# Patient Record
Sex: Female | Born: 1990 | Race: White | Hispanic: No | Marital: Single | Smoking: Former smoker
Health system: Southern US, Community
[De-identification: ages and names within clinical notes are randomized; demographics above are authoritative.]

## PROBLEM LIST (undated history)

## (undated) DIAGNOSIS — Z89512 Acquired absence of left leg below knee: Secondary | ICD-10-CM

## (undated) DIAGNOSIS — Z789 Other specified health status: Secondary | ICD-10-CM

## (undated) HISTORY — PX: KNEE SURGERY: SHX244

## (undated) HISTORY — PX: OTHER SURGICAL HISTORY: SHX169

## (undated) HISTORY — PX: LEG AMPUTATION: SHX1105

---
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
## 2011-01-14 NOTE — Discharge Summary (Signed)
NAMEBRANDELYN, Brandi Casey NO.:  [id number]   MEDICAL RECORD NO.:  [id number]          PATIENT TYPE:  INP   LOCATION:  6125                         FACILITY:  [facility]   PHYSICIAN:  Burnard Bunting, M.D.    DATE OF BIRTH:  June 17, 1991   DATE OF ADMISSION:  12/22/2006  DATE OF DISCHARGE:  12/28/2006                               DISCHARGE SUMMARY   DISCHARGE DIAGNOSIS:  Right knee multi-ligament injury.   SECONDARY DIAGNOSES:  1. History of left above-the-knee amputation.  2. Increased body mass index.   OPERATION/PROCEDURE:  Right knee push off corner reconstruction with  Achilles allograft with diagnostic arthroscopy performed December 22, 2006.   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  She presents after failure of  conservative management.  She and her grandmother, who is her caretaker,  understand the risks and benefits.   HOSPITAL COURSE:  The patient was admitted to orthopedic surgery December 22, 2006.  She underwent push off corner reconstruction December 22, 2006.  She tolerated the procedure well without any immediate complications.  Postop day #1, her dorsiflexion and inversion were weak, but her plantar  flexion was intact.  She has mild dorsal paresthesias.  The weakness and  paresthesias resolved by postop day 2 to 3.  She was okay to weight bear  with knee immobilizer.  She was slow to mobilize because of her  contralateral above-the-knee amputation.  Compartments were soft on  postop day #1.  Foot remained perfused.  She was started on CPN machine  for range of motion.  She otherwise had an unremarkable recovery.  Incision was intact on postop day #3.  By postop day #6, the patient has  perfused foot which was mobile with dorsiflexion, plantar flexion  insensate.  She was discharged home in good condition with partial  weight bearing on the right lower extremity with a knee immobilizer.   DISCHARGE MEDICATIONS:  1. Percocet 1 or 2 p.o. every three to four hours p.r.n. pain.  2. Robaxin 500 mg p.o. every eight hours for muscle relaxer.   She will follow up with me in 7 days for suture removal.      Burnard Bunting, M.D.  Electronically Signed     GSD/MEDQ  D:  02/10/2007  T:  02/11/2007  Job:  [id number]

## 2011-01-17 NOTE — Op Note (Signed)
NAME:  Brandi Casey, Brandi Casey                        ACCOUNT NO.:  [id number]   MEDICAL RECORD NO.:  [id number]                   PATIENT TYPE:  AMB   LOCATION:  DSC                                  FACILITY:   PHYSICIAN:  Lucky Cowboy, M.D.                    DATE OF BIRTH:  28-Sep-1990   DATE OF PROCEDURE:  12/16/2002  DATE OF DISCHARGE:                                 OPERATIVE REPORT   PREOPERATIVE DIAGNOSIS:  Chronic eustachian tube dysfunction with conductive  hearing losses and serous otitis media.   POSTOPERATIVE DIAGNOSIS:  Chronic eustachian tube dysfunction with  conductive hearing losses and serous otitis media.   PROCEDURE:  Bilateral tympanotomy with tube placement.   SURGEON:  Lucky Cowboy, M.D.   ANESTHESIA:  General.   ESTIMATED BLOOD LOSS:  None.   COMPLICATIONS:  None.   INDICATIONS:  This patient is a 20 year old female with intermittent ear  pain.  There has been some concern for hearing in which a conductive hearing  loss was confirmed by audiometry in late March.  She was found to have  severely retracted tympanic membranes with scant, serous, middle ear fluid.  For these reasons, tympanotomy tubes are placed.   FINDINGS:  The patient was noted to have severely retracted tympanic  membranes;  however, there was only scant, middle ear serous fluid and no  significant edema.   PROCEDURE:  The patient was taken to the operating room and placed on the  table in the supine position.  She was then placed under general mask  anesthesia and a #4 ear speculum placed into the right external auditory  canal.  With the aid of the operating microscope, cerumen was removed with a  curet and suctioned.  A myringotomy knife was used to make an incision in  the anterior-inferior quadrant.  Middle ear fluid was evacuated and an  Activent tube placed through the tympanic membrane and secured in place with  a pick.  Ciprodex Otic was instilled.  Attention was turned to the left  ear.  In a similar fashion, cerumen was removed.  A myringotomy knife was used to  make an incision in the anterior-inferior quadrant. Middle ear fluid was  evacuated and an Activent tube placed through the  tympanic membrane and secured in place with a pick.  Ciprodex Otic was  instilled.  The patient was awakened from anesthesia and taken to the post-  anesthesia care unit in stable condition.  There were no complications.  Return appointment with Dr. Gerilyn Pilgrim is on 01/06/2003.                                               Lucky Cowboy, M.D.    SJ/MEDQ  D:  12/16/2002  T:  12/16/2002  Job:  [id number]   cc:   Fanny Dance. Rankins, M.D.  [address]  [place]  [place] [address]  Fax: [phone]

## 2011-01-17 NOTE — Op Note (Signed)
NAME:  Brandi Casey, Brandi Casey NO.:  [id number]   MEDICAL RECORD NO.:  [id number]          PATIENT TYPE:  INP   LOCATION:  2550                         FACILITY:  [facility]   PHYSICIAN:  Burnard Bunting, M.D.    DATE OF BIRTH:  March 26, 1991   DATE OF PROCEDURE:  12/22/2006  DATE OF DISCHARGE:                               OPERATIVE REPORT   PREOPERATIVE DIAGNOSIS:  Right knee posterolateral corner injury.   POSTOPERATIVE DIAGNOSIS:  Right knee posterolateral corner injury.   PROCEDURE:  Right knee diagnostic arthroscopy with posterolateral corner  reconstruction with Achilles tendon allograft.   SURGEON:  Burnard Bunting, M.D.   ASSISTANT:  Jerolyn Shin. Tresa Res, M.D.   ANESTHESIA:  General endotracheal.   ESTIMATED BLOOD LOSS:  25 mL.   DRAINS:  None.  Hemovac utilized x1.   TOURNIQUET TIME:  Two hours at 300 mmHg.   INDICATIONS:  Brandi Casey is a morbidly obese 20 year old female who  injured her right knee.  Examination in the office demonstrated lateral  LCL injury and posterolateral corner injury.  She was treated in a brace  and the lateral collateral ligament injury has improved.  However, she  continues to report pain and symptomatic posterolateral corner  instability.  She presents now for operative management after  explanation of risks and benefits.   OPERATIVE FINDINGS:  Examination under anesthesia:  Range of motion at 7  degrees of hyperextension to full flexion with stability to valgus  stress at 0 and 30.  In hyperextension she had no openings to varus  stress.  In full extension the patient had about 1-mm opening to varus  stress, and at 30 degrees the patient had about 3-mm opening to varus  stress.  The left leg is not available for comparison because of  amputation.  The patient had an intact ACL/PCL.  The patient did have  increased external rotation at 30 degrees, but not at 90 degrees,  consistent with posterolateral corner injury.   Diagnostic  arthroscopy:  1. Intact patellofemoral compartment.  2. Intact lateral compartment.  3. Intact medial compartment.  4. Intact ACL/PCL.   PROCEDURE IN DETAIL:  The patient was brought to the operating room  where general endotracheal anesthesia was induced.  Preoperative  antibiotics were administered.  The right leg was prepped with DuraPrep  solution and draped in a sterile manner.  Collier Flowers was used to cover the  operative field.  __________ anatomy was identified, including the  medial and lateral aspect of the patella tendon, the medial and lateral  joint line.  An anterior inferior lateral portal was then established in  order for good visualization.  Diagnostic arthroscopy was performed.  The patellofemoral compartment was intact.  The lateral compartment was  intact.  The medial compartment was intact.  The ACL and PCL were  intact.  There were no intraarticular findings.  At this time, the  instruments were removed, portals were closed using 3-0 nylon suture.  The incision was then made on the lateral aspect of the patient's leg,  centered down to Gerdy tubercle, extending between the fibular  head and  the lateral condyle.  Skin and subcutaneous were sharply divided.  Full  thickness skin flaps were elevated off the fascia lata and iliotibial  band.  Three fascial windows of [place] were then created.  The first  fascial window inferiorly behind the biceps tendon was used to isolate  and protect the peroneal nerve.  This was dissected proximally through  the bifurcation and distally to its penetration to the anterior  compartment fascia.  Once the peroneal nerve was isolated, the inferior  aspect of the iliotibial band was entered and dissection was performed  anterior to the lateral gastroc.  A portion of the iliotibial band was  then incised which allowed access to the location of the popliteus.  At  this time, dissection was performed posteriorly to the sulcus of the   popliteus.  The anterior aspect of the popliteal hiatus was identified  and a 20 x 10-mm drill hole was made over the anterior aspect of the  popliteus attachment to the femur.  A guide pin was then placed under  direct visualization and with fluoroscopic assistance between the Gerdy  tubercle and the tibial tubercle.  A 10-mm tunnel was then drilled over  the guide pin with a curet used to protect the posterior neurovascular  structures.  This was also done under fluoroscopic guidance after  correct confirmation of the guide pin.  After creation of a tunnel, the  Achilles tendon allograft, which was being prepared on the back table  concurrently was then fastened into the femur using an 8 x 22-mm  interference screw with good fit obtained.  The graft was then passed  medial to the lateral collateral ligament and down through the tunnel  created in the tibia from posterior to anterior.  With the knee at 30  degrees of flexion and maximum internal rotation, the graft was stapled  down to the tibia.  Improvement in external rotation and stability was  noted.  The patient's knee was taken through a range of motion and found  to be stable.  At this time, the tourniquet was released.  Bleeding  points were counter controlled using bipolar and normal electrocautery.  Fascial wound edges were closed using 0 Vicryl suture.  Skin was closed  using interrupted inverted 0 Vicryl suture, 2-0 Vicryl suture, and a  running 3-0 Prolene.  Hemovac drain was placed.  The patient's foot was  perfused.  A bulky dressing and knee immobilizer was placed.  It should  be noted that the patient was morbidly obese for her age.  Body mass  index exceeded 35.  The assistance of Dr. Tresa Res was required for both  graft preparation and retraction of important neurovascular structures.  His assistance was of maximum necessity.      Burnard Bunting, M.D.  Electronically Signed    GSD/MEDQ  D:  12/22/2006  T:   12/22/2006  Job:  [id number]

## 2011-11-29 ENCOUNTER — Other Ambulatory Visit: Payer: Self-pay

## 2011-11-29 ENCOUNTER — Encounter (HOSPITAL_COMMUNITY): Payer: Self-pay

## 2011-11-29 ENCOUNTER — Emergency Department (HOSPITAL_COMMUNITY): Payer: Self-pay

## 2011-11-29 ENCOUNTER — Emergency Department (HOSPITAL_COMMUNITY)
Admission: EM | Admit: 2011-11-29 | Discharge: 2011-11-29 | Disposition: A | Discharge status: Home / Self Care | Payer: Self-pay | Attending: Emergency Medicine | Admitting: Emergency Medicine

## 2011-11-29 DIAGNOSIS — M546 Pain in thoracic spine: Secondary | ICD-10-CM | POA: Insufficient documentation

## 2011-11-29 DIAGNOSIS — M549 Dorsalgia, unspecified: Secondary | ICD-10-CM

## 2011-11-29 DIAGNOSIS — R079 Chest pain, unspecified: Secondary | ICD-10-CM | POA: Insufficient documentation

## 2011-11-29 LAB — BASIC METABOLIC PANEL
BUN: 14 mg/dL (ref 6–23)
CO2: 26 mEq/L (ref 19–32)
Chloride: 106 mEq/L (ref 96–112)
Creatinine, Ser: 0.63 mg/dL (ref 0.50–1.10)
Glucose, Bld: 91 mg/dL (ref 70–99)

## 2011-11-29 MED ORDER — OXYCODONE-ACETAMINOPHEN 5-325 MG PO TABS
1.0000 | ORAL_TABLET | Freq: Once | ORAL | Status: AC
  Administered 2011-11-29: 1 via ORAL
  Filled 2011-11-29: qty 1

## 2011-11-29 MED ORDER — IBUPROFEN 600 MG PO TABS: 600.0000 mg | ORAL_TABLET | Freq: Four times a day (QID) | ORAL | Status: AC | PRN

## 2011-11-29 MED ORDER — IBUPROFEN 200 MG PO TABS
600.0000 mg | ORAL_TABLET | Freq: Once | ORAL | Status: AC
  Administered 2011-11-29: 600 mg via ORAL
  Filled 2011-11-29: qty 3

## 2011-11-29 NOTE — ED Notes (Signed)
Pt. Was at work and took a deep breath in and developed severe chest pain. Denies any injury. Pt. Reports that the pain is in her mid back area.   Pt. Also feels sob with the pain

## 2011-11-29 NOTE — ED Notes (Addendum)
Patient state she was at work this morning and started to had chest pain and pressure radiating to upper back. EKG captured on arrival to ED. Patient denies N/V/D/F.

## 2011-11-29 NOTE — ED Provider Notes (Signed)
History     CSN: [id number]  Arrival date & time 11/29/11  0707   First MD Initiated Contact with Patient 11/29/11 0801      Chief Complaint  Patient presents with  . Back Pain    (Consider location/radiation/quality/duration/timing/severity/associated sxs/prior treatment) HPI  20yoF is a healthy presents with back pain, chest pain. The patient states that approximately 6:30 this morning she began to experience sharp back pain in the thoracic region radiating forward to her chest. She states that the pain is worse with deep breathing. Not worse with movement. She denies nausea, vomiting, diaphoresis. There is no radiation otherwise of the pain. SHe denies fevers, chills, cough. Denies h/o VTE in self or family. No recent hosp/surg/immob. No h/o cancer. Denies exogenous hormone use, no leg pain or swelling. No history of similar.   ED Notes, ED Provider Notes from 11/29/11 0000 to 11/29/11 07:20:54       Kathe Becton, RN 11/29/2011 07:18      Pt. Was at work and took a deep breath in and developed severe chest pain. Denies any injury. Pt. Reports that the pain is in her mid back area. Pt. Also feels sob with the pain    History reviewed. No pertinent past medical history.  Past Surgical History  Procedure Date  . Prostectic leg lt     No family history on file.  History  Substance Use Topics  . Smoking status: Never Smoker   . Smokeless tobacco: Not on file  . Alcohol Use: No    OB History    Grav Para Term Preterm Abortions TAB SAB Ect Mult Living                  Review of Systems  All other systems reviewed and are negative.  except as noted HPI   Allergies  Review of patient's allergies indicates no known allergies.  Home Medications   Current Outpatient Rx  Name Route Sig Dispense Refill  . IBUPROFEN 600 MG PO TABS Oral Take 1 tablet (600 mg total) by mouth every 6 (six) hours as needed for pain. 30 tablet 0    BP 119/77  Pulse 62  Temp(Src) 97 F  (36.1 C) (Oral)  Resp 20  SpO2 98%  LMP 10/30/2011  Physical Exam  Nursing note and vitals reviewed. Constitutional: She is oriented to person, place, and time. She appears well-developed.  HENT:  Head: Atraumatic.  Mouth/Throat: Oropharynx is clear and moist.  Eyes: Conjunctivae and EOM are normal. Pupils are equal, round, and reactive to light.  Neck: Normal range of motion. Neck supple.  Cardiovascular: Normal rate, regular rhythm, normal heart sounds and intact distal pulses.   Pulmonary/Chest: Effort normal and breath sounds normal. No respiratory distress. She has no wheezes. She has no rales. She exhibits no tenderness.  Abdominal: Soft. She exhibits no distension. There is no tenderness. There is no rebound and no guarding.  Musculoskeletal: Normal range of motion.       +midline thoracic ttp  Neurological: She is alert and oriented to person, place, and time.       Strength 5/5 all extr  Skin: Skin is warm and dry. No rash noted.  Psychiatric: She has a normal mood and affect.   TRIAGE  Date: 11/29/2011  Rate: 77   Rhythm: normal sinus rhythm ? Ectopic atrial  QRS Axis: normal  Intervals: normal  ST/T Wave abnormalities: nonspecific T wave changes  Conduction Disutrbances:none  Narrative Interpretation:  Old EKG Reviewed: none available   Date: 11/29/2011  Rate: 67  Rhythm: normal sinus rhythm and sinus arrhythmia  QRS Axis: normal  Intervals: normal  ST/T Wave abnormalities: nonspecific T wave changes t wave inv III  Conduction Disutrbances:none  Narrative Interpretation:   Old EKG Reviewed: changes noted   ED Course  Procedures (including critical care time)   Labs Reviewed  BASIC METABOLIC PANEL  D-DIMER, QUANTITATIVE  POCT PREGNANCY, URINE   Dg Chest 2 View  11/29/2011  *RADIOLOGY REPORT*  Clinical Data: Back pain.  CHEST - 2 VIEW  Comparison: None.  Findings: No pneumothorax. Lungs clear.  Heart size and pulmonary vascularity normal.  No  effusion.  Visualized bones unremarkable.  IMPRESSION: No acute disease  Original Report Authenticated By: Thora Lance III, M.D.    1. Chest pain   2. Back pain     MDM  Atypical cp/back pain. No risk factors for PE but pleuritic and sudden onset in nature. D dimer negative. CXR unremarkable- no ptx, pna. Low susp pericarditis/myocarditis/endocarditis. Feeling better after percocet and ibuprofen. Will discharge home with precautions for return.         Forbes Cellar, MD 11/29/11 [id number]

## 2011-11-29 NOTE — Discharge Instructions (Signed)
Chest Pain (Nonspecific) It is often hard to give a specific diagnosis for the cause of chest pain. There is always a chance that your pain could be related to something serious, such as a heart attack or a blood clot in the lungs. You need to follow up with your caregiver for further evaluation. CAUSES   Heartburn.   Pneumonia or bronchitis.   Anxiety or stress.   Inflammation around your heart (pericarditis) or lung (pleuritis or pleurisy).   A blood clot in the lung.   A collapsed lung (pneumothorax). It can develop suddenly on its own (spontaneous pneumothorax) or from injury (trauma) to the chest.   Shingles infection (herpes zoster virus).  The chest wall is composed of bones, muscles, and cartilage. Any of these can be the source of the pain.  The bones can be bruised by injury.   The muscles or cartilage can be strained by coughing or overwork.   The cartilage can be affected by inflammation and become sore (costochondritis).  DIAGNOSIS  Lab tests or other studies, such as X-rays, electrocardiography, stress testing, or cardiac imaging, may be needed to find the cause of your pain.  TREATMENT   Treatment depends on what may be causing your chest pain. Treatment may include:   Acid blockers for heartburn.   Anti-inflammatory medicine.   Pain medicine for inflammatory conditions.   Antibiotics if an infection is present.   You may be advised to change lifestyle habits. This includes stopping smoking and avoiding alcohol, caffeine, and chocolate.   You may be advised to keep your head raised (elevated) when sleeping. This reduces the chance of acid going backward from your stomach into your esophagus.   Most of the time, nonspecific chest pain will improve within 2 to 3 days with rest and mild pain medicine.  HOME CARE INSTRUCTIONS   If antibiotics were prescribed, take your antibiotics as directed. Finish them even if you start to feel better.   For the next few  days, avoid physical activities that bring on chest pain. Continue physical activities as directed.   Do not smoke.   Avoid drinking alcohol.   Only take over-the-counter or prescription medicine for pain, discomfort, or fever as directed by your caregiver.   Follow your caregiver's suggestions for further testing if your chest pain does not go away.   Keep any follow-up appointments you made. If you do not go to an appointment, you could develop lasting (chronic) problems with pain. If there is any problem keeping an appointment, you must call to reschedule.  SEEK MEDICAL CARE IF:   You think you are having problems from the medicine you are taking. Read your medicine instructions carefully.   Your chest pain does not go away, even after treatment.   You develop a rash with blisters on your chest.  SEEK IMMEDIATE MEDICAL CARE IF:   You have increased chest pain or pain that spreads to your arm, neck, jaw, back, or abdomen.   You develop shortness of breath, an increasing cough, or you are coughing up blood.   You have severe back or abdominal pain, feel nauseous, or vomit.   You develop severe weakness, fainting, or chills.   You have a fever.  THIS IS AN EMERGENCY. Do not wait to see if the pain will go away. Get medical help at once. Call your local emergency services (911 in [place] Do not drive yourself to the hospital. MAKE SURE YOU:   Understand these instructions.     Will watch your condition.   Will get help right away if you are not doing well or get worse.  Document Released: 05/28/2005 Document Revised: 08/07/2011 Document Reviewed: 03/23/2008 ExitCare Patient Information 2012 [employer]  RESOURCE GUIDE  Dental Problems  Patients with Medicaid: [facility]                     [facility] [address]                                           [address] Phone:  [phone]                                                    Phone:  [phone]  If unable to pay or uninsured, contact:  [facility] or [facility] to become qualified for the adult dental clinic.  Chronic Pain Problems Contact [facility]  [phone] Patients need to be referred by their primary care doctor.  Insufficient Money for Medicine Contact [employer]:  call "211" or [facility] 271-5999.  No Primary Care Doctor Call [facility]  [phone] Other agencies that provide inexpensive medical care    [facility]  [phone]     Internal Medicine  [phone]    [facility]  [phone]    Women's Clinic  [phone]    [facility]  [phone]    [facility]  [phone]  Psychological Services [facility]  [phone] [facility]  [phone] [facility]   [phone] (emergency services [phone])  Abuse/Neglect [place] Child Abuse Hotline (336) 641-3795 [place] [facility] Abuse Hotline 800-378-5315 (After Hours)  Emergency Shelter  [facility] (336) 271-5985  [facility] (336) 275-9566 [facility] (704) 372-4663  MRSA Hotline #:   832-7006    [place] [facility]  [facility] [place]  [employer]                           [facility] [address] [place]                     [address]         [address]  [place]                                               [place]                              [place] Phone:  [phone]                                  Phone:  [phone]                   Phone:    [phone]  [facility] Phone:  [phone]  [place] Child Abuse Hotline (336) 342-1394 (336) 342-3537 (After Hours)  

## 2012-02-24 ENCOUNTER — Encounter (HOSPITAL_COMMUNITY): Payer: Self-pay | Admitting: *Deleted

## 2012-02-24 ENCOUNTER — Emergency Department (INDEPENDENT_AMBULATORY_CARE_PROVIDER_SITE_OTHER)
Admission: EM | Admit: 2012-02-24 | Discharge: 2012-02-24 | Disposition: A | Discharge status: Home / Self Care | Payer: Self-pay | Source: Home / Self Care | Attending: Family Medicine | Admitting: Family Medicine

## 2012-02-24 DIAGNOSIS — J020 Streptococcal pharyngitis: Secondary | ICD-10-CM

## 2012-02-24 MED ORDER — CEFDINIR 300 MG PO CAPS: 300.0000 mg | ORAL_CAPSULE | Freq: Two times a day (BID) | ORAL | Status: AC

## 2012-02-24 NOTE — Discharge Instructions (Signed)
Drink lots of fluids, take all of medicine, use lozenges as needed.return if needed °

## 2012-02-24 NOTE — ED Notes (Signed)
Pt   Reports    sorethroat     X  3  Days          She  Reports  Her  Glands  Seem swollen  And  Tender       She  Is   Speaking  In     Complete  sentances  She  Is  Sitting upright on  Exam table          pleaseant

## 2012-02-24 NOTE — ED Provider Notes (Signed)
History     CSN: [id number]  Arrival date & time 02/24/12  1132   First MD Initiated Contact with Patient 02/24/12 1137      Chief Complaint  Patient presents with  . Sore Throat    (Consider location/radiation/quality/duration/timing/severity/associated sxs/prior treatment) Patient is a 21 y.o. female presenting with pharyngitis. The history is provided by the patient.  Sore Throat This is a new problem. The current episode started more than 2 days ago. The problem has been gradually worsening. The symptoms are aggravated by swallowing.    History reviewed. No pertinent past medical history.  Past Surgical History  Procedure Date  . Prostectic leg lt     History reviewed. No pertinent family history.  History  Substance Use Topics  . Smoking status: Never Smoker   . Smokeless tobacco: Not on file  . Alcohol Use: No    OB History    Grav Para Term Preterm Abortions TAB SAB Ect Mult Living                  Review of Systems  Constitutional: Negative.   HENT: Positive for sore throat.   Gastrointestinal: Negative.   Skin: Negative for rash.  Hematological: Positive for adenopathy.    Allergies  Review of patient's allergies indicates no known allergies.  Home Medications   Current Outpatient Rx  Name Route Sig Dispense Refill  . CEFDINIR 300 MG PO CAPS Oral Take 1 capsule (300 mg total) by mouth 2 (two) times daily. 20 capsule 0    BP 136/85  Pulse 78  Temp 98.2 F (36.8 C) (Oral)  Resp 16  SpO2 100%  LMP 01/31/2012  Physical Exam  Nursing note and vitals reviewed. Constitutional: She appears well-developed and well-nourished.  HENT:  Right Ear: External ear normal.  Left Ear: External ear normal.  Mouth/Throat: Oropharyngeal exudate present.  Eyes: Pupils are equal, round, and reactive to light.  Neck: Normal range of motion. Neck supple.  Cardiovascular: Regular rhythm.   Pulmonary/Chest: Breath sounds normal.  Lymphadenopathy:    She  has cervical adenopathy.  Skin: No rash noted.    ED Course  Procedures (including critical care time)   Labs Reviewed  POCT RAPID STREP A ([facility] CARE ONLY)   No results found.   1. Strep sore throat       MDM          Linna Hoff, MD 02/24/12 1239

## 2013-08-01 IMAGING — CR DG CHEST 2V
2 series · 2 of 2 positions shown · non-contrast
Comparison: None.

CLINICAL DATA: Back pain.

CHEST - 2 VIEW

[w chest pa]
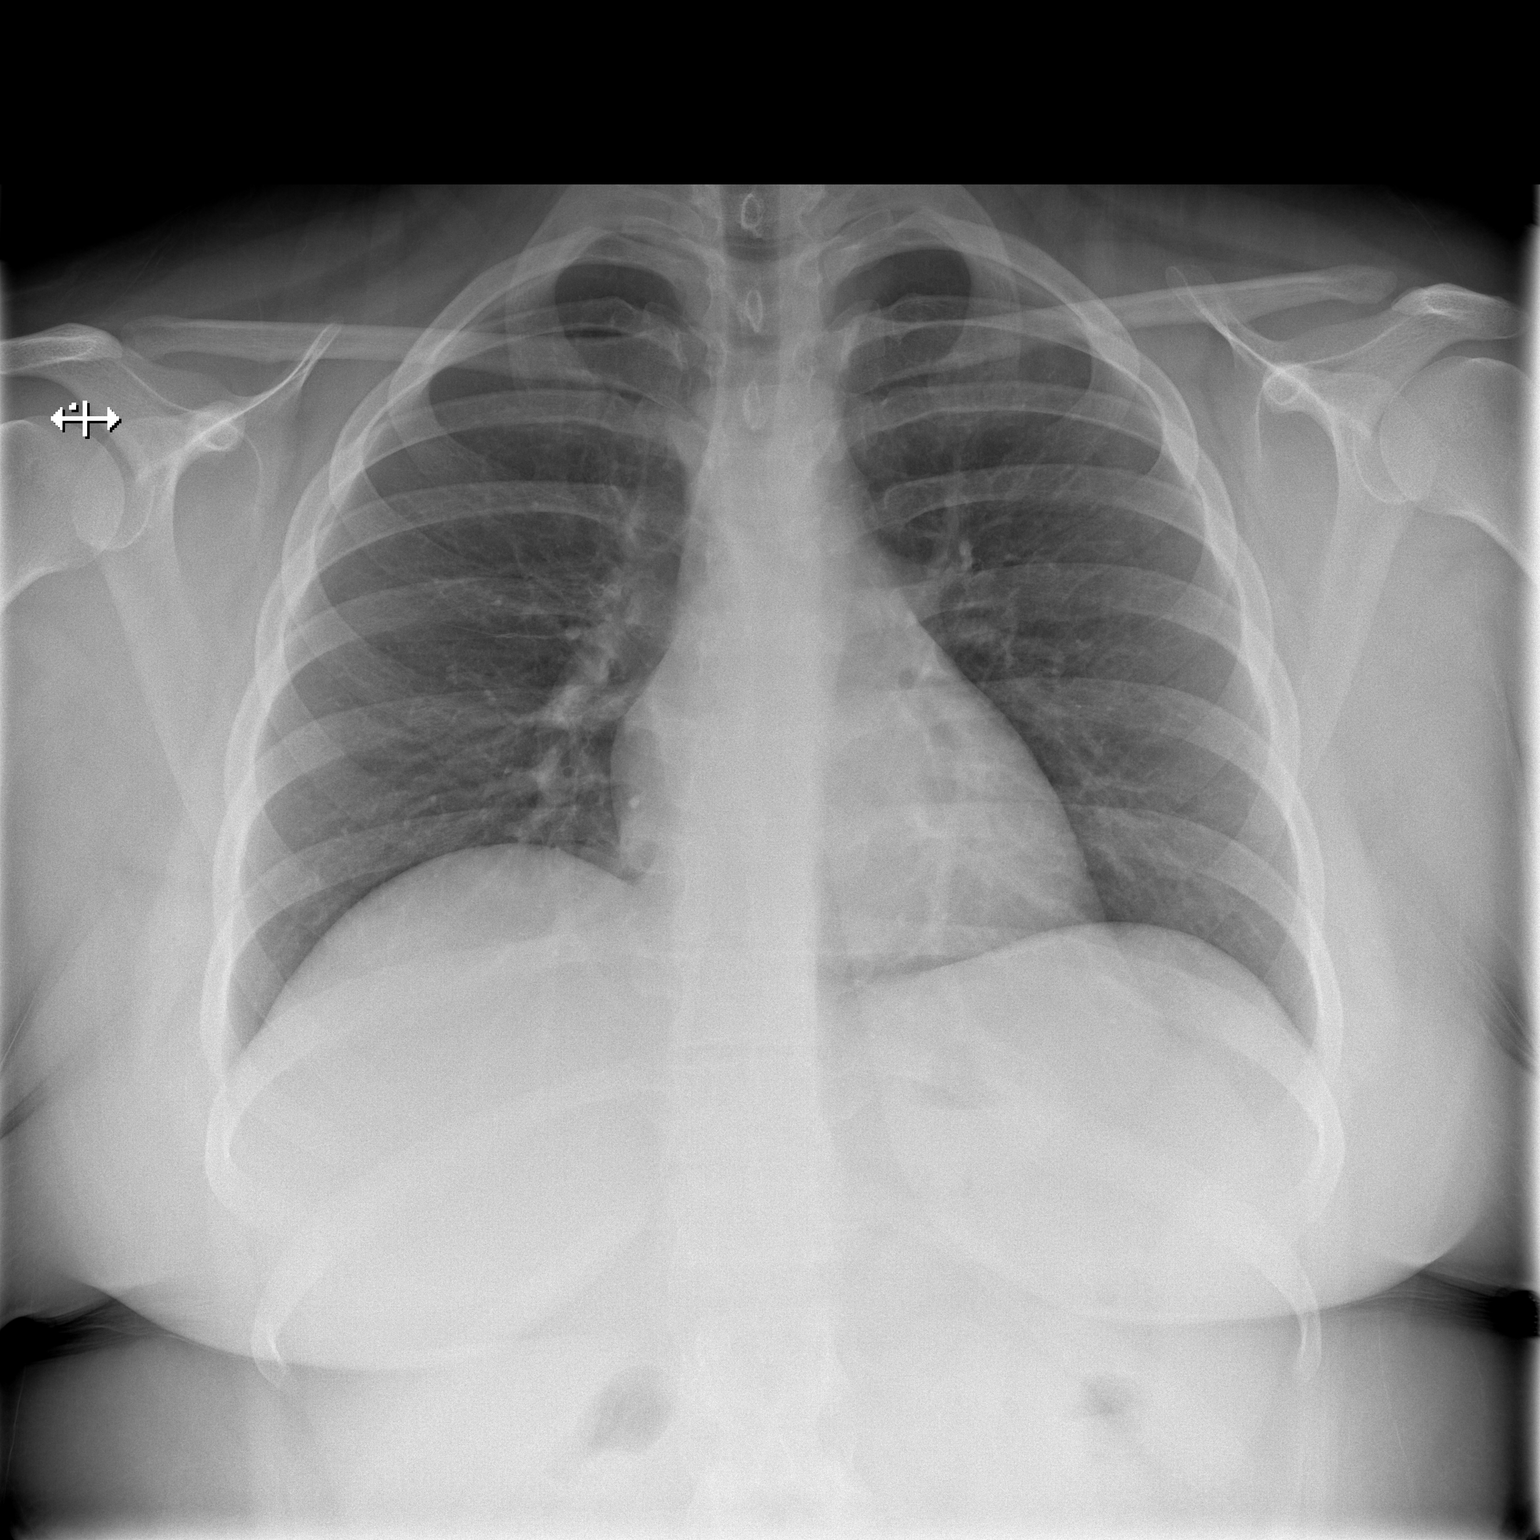

[w chest lat]
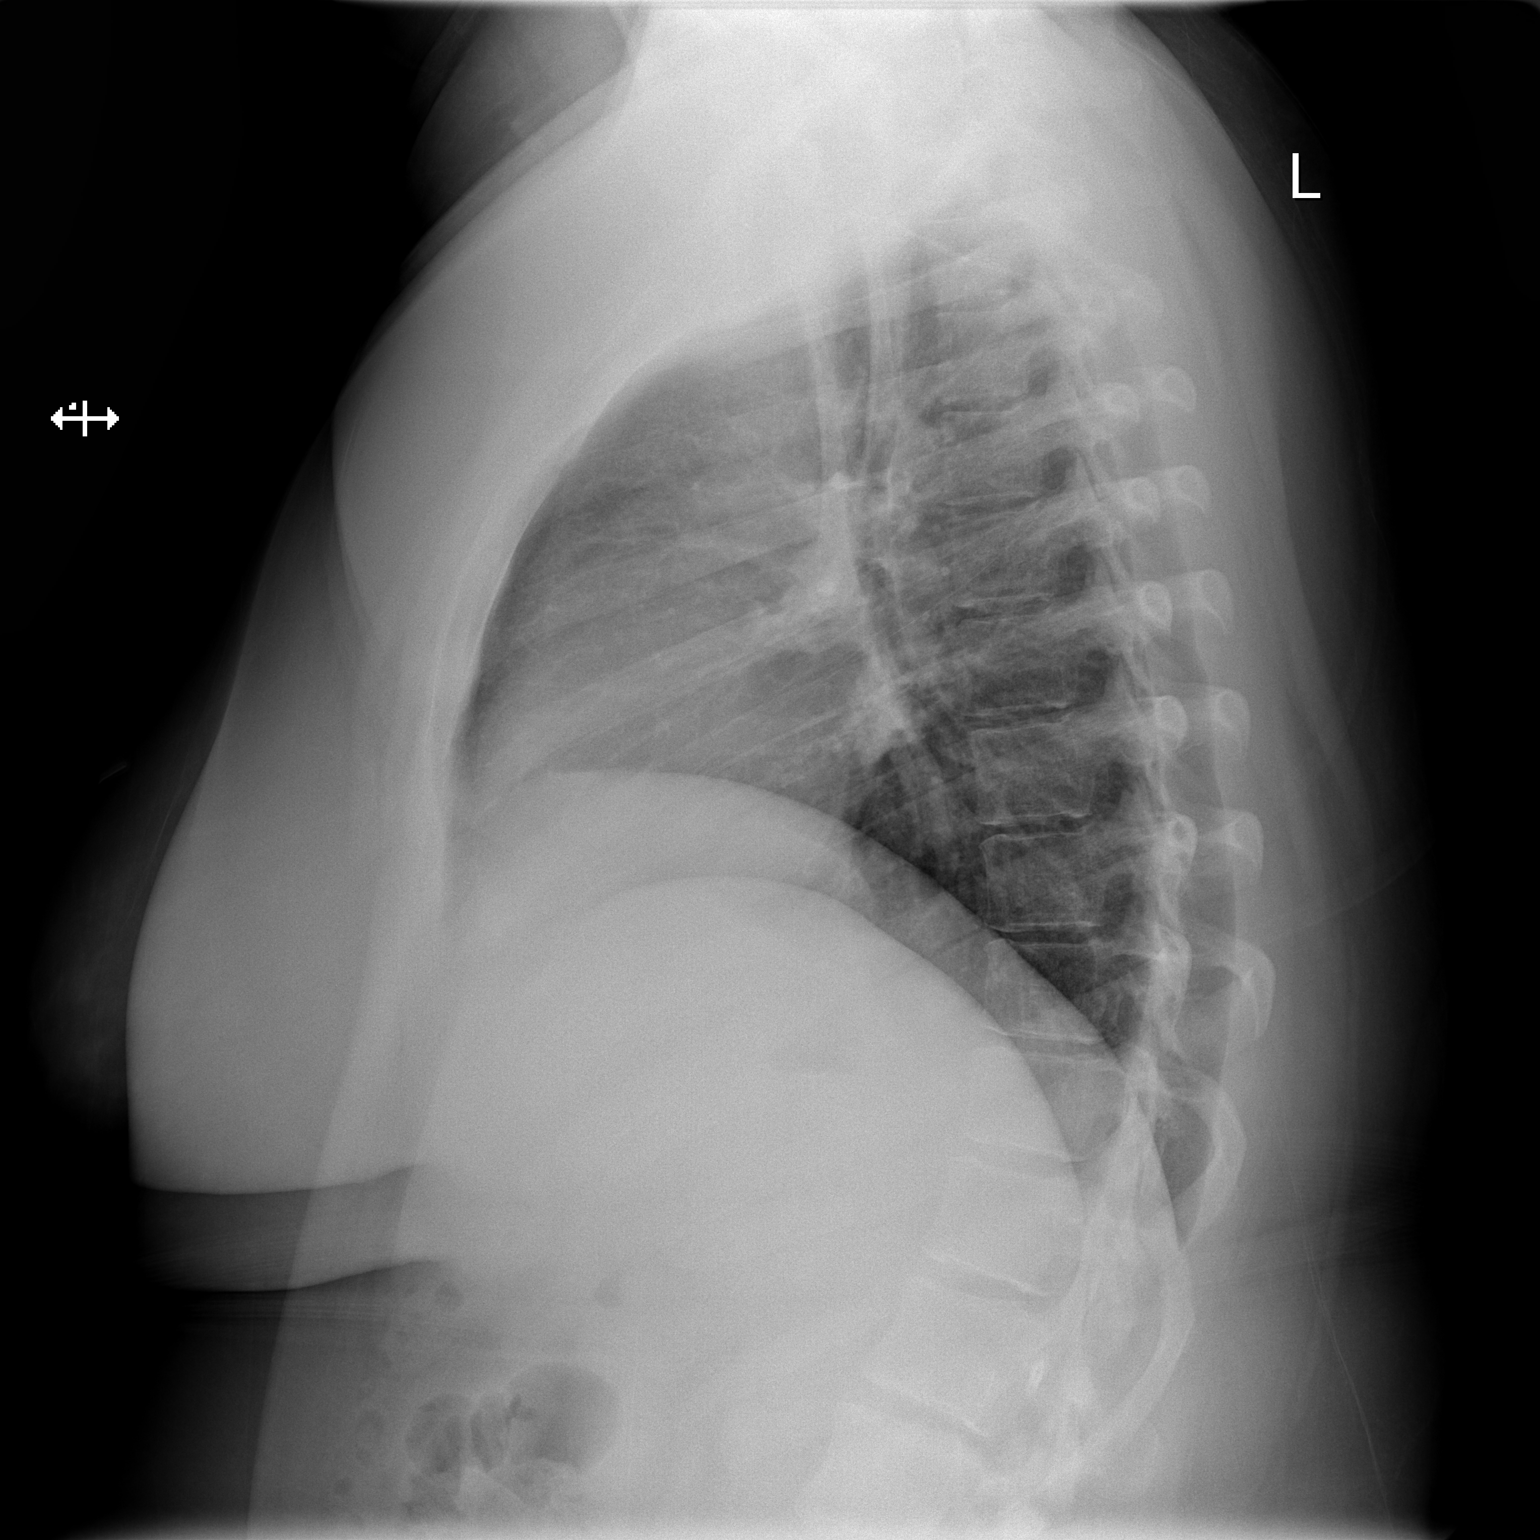

[2 of 2 positions shown; findings below may reference images not displayed]

FINDINGS: No pneumothorax. Lungs clear.  Heart size and pulmonary
vascularity normal.  No effusion.  Visualized bones unremarkable.
IMPRESSION: No acute disease

## 2016-11-03 ENCOUNTER — Encounter (HOSPITAL_COMMUNITY): Payer: Self-pay | Admitting: *Deleted

## 2016-11-03 ENCOUNTER — Inpatient Hospital Stay (HOSPITAL_COMMUNITY)
Admission: AD | Admit: 2016-11-03 | Discharge: 2016-11-03 | Disposition: A | Discharge status: Home / Self Care | Payer: Self-pay | Source: Ambulatory Visit | Attending: Family Medicine | Admitting: Family Medicine

## 2016-11-03 DIAGNOSIS — N939 Abnormal uterine and vaginal bleeding, unspecified: Secondary | ICD-10-CM | POA: Insufficient documentation

## 2016-11-03 DIAGNOSIS — F17210 Nicotine dependence, cigarettes, uncomplicated: Secondary | ICD-10-CM | POA: Insufficient documentation

## 2016-11-03 HISTORY — DX: Other specified health status: Z78.9

## 2016-11-03 LAB — CBC
HCT: 40.6 % (ref 36.0–46.0)
Hemoglobin: 13.1 g/dL (ref 12.0–15.0)
MCH: 26 pg (ref 26.0–34.0)
MCHC: 32.3 g/dL (ref 30.0–36.0)
MCV: 80.6 fL (ref 78.0–100.0)
Platelets: 382 10*3/uL (ref 150–400)
RBC: 5.04 MIL/uL (ref 3.87–5.11)
RDW: 16.4 % — AB (ref 11.5–15.5)
WBC: 11.5 10*3/uL — ABNORMAL HIGH (ref 4.0–10.5)

## 2016-11-03 LAB — POCT PREGNANCY, URINE: PREG TEST UR: NEGATIVE

## 2016-11-03 MED ORDER — NORGESTIMATE-ETH ESTRADIOL 0.25-35 MG-MCG PO TABS: 1.0000 | ORAL_TABLET | Freq: Every day | ORAL | 11 refills | Status: DC

## 2016-11-03 NOTE — Discharge Instructions (Signed)

## 2016-11-03 NOTE — MAU Provider Note (Signed)
History     CSN: [id number]  Arrival date and time: 11/03/16 1135   First Provider Initiated Contact with Patient 11/03/16 1210      Chief Complaint  Patient presents with  . Vaginal Bleeding   HPI   Brandi Casey is a 26 y.o. female G0P0000 here in MAU with complaints of heavy vaginal bleeding. She started her period on 2/18 which was the normal time for her cycle. The bleeding has continued and is heavier than normal.  She does not see a GYN. Not currently on any birth-control or hormones. In the last 24 hours she has changed her pad/tampon 5 times. 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 She states this helped a lot. No history of PE or DVT.   OB History    Gravida Para Term Preterm AB Living   0 0 0 0 0 0   SAB TAB Ectopic Multiple Live Births   0 0 0 0 0      Past Medical History:  Diagnosis Date  . Medical history non-contributory     Past Surgical History:  Procedure Laterality Date  . prostectic leg lt      History reviewed. No pertinent family history.  Social History  Substance Use Topics  . Smoking status: Current Every Day Smoker    Types: E-cigarettes  . Smokeless tobacco: Never Used  . Alcohol use No    Allergies: No Known Allergies  No prescriptions prior to admission.   Results for orders placed or performed during the hospital encounter of 11/03/16 (from the past 48 hour(s))  CBC     Status: Abnormal   Collection Time: 11/03/16 12:02 PM  Result Value Ref Range   WBC 11.5 (H) 4.0 - 10.5 K/uL   RBC 5.04 3.87 - 5.11 MIL/uL   Hemoglobin 13.1 12.0 - 15.0 g/dL   HCT 40.9 81.1 - 91.4 %   MCV 80.6 78.0 - 100.0 fL   MCH 26.0 26.0 - 34.0 pg   MCHC 32.3 30.0 - 36.0 g/dL   RDW 78.2 (H) 95.6 - 21.3 %   Platelets 382 150 - 400 K/uL  Pregnancy, urine POC     Status: None   Collection Time: 11/03/16 12:14 PM  Result Value Ref Range   Preg Test, Ur NEGATIVE NEGATIVE    Comment:         THE SENSITIVITY OF THIS METHODOLOGY IS >24 mIU/mL    Review of Systems  Constitutional: Positive for fatigue (All the time. ).  Neurological: Negative for dizziness.   Physical Exam   Blood pressure 139/85, pulse 88, resp. rate 18, last menstrual period 10/19/2016.  Physical Exam  Constitutional: She is oriented to person, place, and time. She appears well-developed and well-nourished.  HENT:  Head: Normocephalic.  Eyes: Pupils are equal, round, and reactive to light.  Respiratory: Effort normal.  GI: Soft. She exhibits no distension. There is no tenderness. There is no rebound.  Genitourinary:  Genitourinary Comments: Vagina -Small amount of dark red blood in the vaginal canal. Golf ball size clot noted in the vagina. Cervix - No contact bleeding, small amount of active bleeding  Bimanual exam: Cervix closed Uterus non tender, normal size Adnexa non tender, no masses bilaterally GC/Chlam, wet prep done Chaperone present for exam.   Musculoskeletal: Normal range of motion.  Neurological: She is alert and oriented to person, place, and time.  Skin: Skin is warm.  MAU Course  Procedures  None  MDM  CBC  Patient declines STI testing or HIV testing.   Assessment and Plan   A:  1. Abnormal vaginal bleeding     P:  Discharge home in stable condition Rx: Sprintec Return to MAU if symptoms worsen Bleeding precautions Encouraged patient to establish care with PCP or GYN. PCP  Contact number given.   Duane LopeJennifer I Rasch, NP 11/03/2016 2:17 PM

## 2016-11-03 NOTE — MAU Note (Signed)
Pt reports the first day of her period stared 2 weeks ago. Had been normal flow but woke up today and she had bled though her tampon and has been having to change it q hour. Passing  Large clots.  Denies feeling dizzy or lightheaded. Denes pain or cramping.

## 2016-11-03 NOTE — MAU Provider Note (Signed)
Pt is a 26 y/o female with no significant past medical history who presents today with heavy vaginal bleeding. She states she originally started spotting on the 18th and her period has been normal since. She noted this morning around 5am that the flow had become much heavier. She also noted clots. She has been changing tampons around every hour since and has switched over to pads. She thinks the flow may be slowing down. Her normal period lasts around 5 days. She denies any abdominal pain, vaginal discharge or painful urination. Denies any SOB, dizzyness, lightheadedness, or syncope.  She has one sexual partner that she has been with since 2013. Denies any chance of pregnancy. Not currently using BC. Denies tobacco or alcohol use.

## 2017-04-17 ENCOUNTER — Ambulatory Visit (INDEPENDENT_AMBULATORY_CARE_PROVIDER_SITE_OTHER): Payer: Self-pay | Admitting: Family

## 2017-05-07 ENCOUNTER — Ambulatory Visit (INDEPENDENT_AMBULATORY_CARE_PROVIDER_SITE_OTHER): Payer: 59 | Admitting: Orthopedic Surgery

## 2017-05-07 ENCOUNTER — Encounter (INDEPENDENT_AMBULATORY_CARE_PROVIDER_SITE_OTHER): Payer: Self-pay | Admitting: Orthopedic Surgery

## 2017-05-07 DIAGNOSIS — M25552 Pain in left hip: Secondary | ICD-10-CM

## 2017-05-08 NOTE — Progress Notes (Signed)
Office Visit Note   Patient: Bascom LevelsDestiny N Yontz           Date of Birth: 06-06-1991           MRN: [id number] Visit Date: 05/07/2017 Requested by: No referring provider defined for this encounter. PCP: Patient, No Pcp Per  Subjective: Chief Complaint  Patient presents with  . eval for prosthesis    HPI: Jaleeya is a patient with left above-knee amputation sustained at age 89.  She has had a standard prosthesis for many years but it is becoming difficult for her to use that.  She is currently working at its lab core.  She has lost about 50 pounds over the past year.  She comes in today for evaluation for new left trans-femoral prosthesis.  Her current height is 55 in weight of 226 which is less than when I saw her over 3 years ago.  Patient is currently wearing a prosthesis that has a hydraulic knee and energy storing foot.  She ambulates with no assistive devices.  She reports several falls this year while wearing her current hydraulic knee.  This instability occurs on a fairly regular basis.  She states that within the current prosthesis she has several holes and there is cracking in the base where her leg gets into the stump.  Current prosthesis is over 10254 years old.              ROS: All systems reviewed are negative as they relate to the chief complaint within the history of present illness.  Patient denies  fevers or chills.   Assessment & Plan: Visit Diagnoses:  1. Pain of left hip joint     Plan: Impression is active 26 year old female with gainful employment who has less than functional over 25 year old prosthesis.  I believe that this type is a good candidate for a new above-knee prosthesis with suction socket micro-processing need technology and energy storing foot.  I reviewed the patient's current gait pattern as well as gait pattern with this new prosthesis and I believe she is a good candidate for the new prosthesis.  This prosthesis should allow him to stop her to return to a  more active and productive lifestyle.  Patient is very motivated and would like to remain as active as possible.  She has lost 50 pounds since January of this year and plans to start going to the gym and working out with her new prosthesis.  A prosthesis should allow her to ambulate more safely and with more confidence.  Also think that prosthetic gait training with physical therapy to assist her full potential as a cake 3 level prosthetic ambulator would be helpful.  The new prosthetic with microprocessing technology knee is a good fit for this young active patient  Follow-Up Instructions: Return if symptoms worsen or fail to improve.   Orders:  No orders of the defined types were placed in this encounter.  No orders of the defined types were placed in this encounter.     Procedures: No procedures performed   Clinical Data: No additional findings.  Objective: Vital Signs: There were no vitals taken for this visit.  Physical Exam:   Constitutional: Patient appears well-developed HEENT:  Head: Normocephalic Eyes:EOM are normal Neck: Normal range of motion Cardiovascular: Normal rate Pulmonary/chest: Effort normal Neurologic: Patient is alert Skin: Skin is warm Psychiatric: Patient has normal mood and affect    Ortho Exam: Orthopedic exam demonstrates reasonable gait but loose prosthetic is noted.  When she walks the prosthetic limb is in a supinated position.  She describes no skin irritation or skin breakdown.  Specialty Comments:  No specialty comments available.  Imaging: No results found.   PMFS History: Patient Active Problem List   Diagnosis Date Noted  . SINUSITIS 05/09/2009  . AMPUTATION, ABOVE KNEE, LEFT, HX OF 01/22/2009  . THYROID STIMULATING HORMONE, ABNORMAL 12/27/2008  . OBESITY, MORBID 10/30/2008  . ACNE 10/30/2008  . OTITIS MEDIA, ACUTE, RIGHT 02/17/2008   Past Medical History:  Diagnosis Date  . Medical history non-contributory     No family  history on file.  Past Surgical History:  Procedure Laterality Date  . prostectic leg lt     Social History   Occupational History  . Not on file.   Social History Main Topics  . Smoking status: Current Every Day Smoker    Types: E-cigarettes  . Smokeless tobacco: Never Used  . Alcohol use No  . Drug use: No  . Sexual activity: No

## 2017-09-01 NOTE — L&D Delivery Note (Signed)
Delivery Note At 5:08 PM a viable female was delivered via  (Presentation: LOA;  ).  APGAR: , ; weight  pending   Placenta status: routine, .  Cord: 3VC with the following complications: none.  Cord pH: sent  Anesthesia:  CLE Episiotomy:  None Lacerations:  2nd degree Suture Repair: 2.0 3.0 vicryl Est. Blood Loss (mL):  350cc   It's a girl - "Brandi Casey"! Mom to postpartum.  Baby to Couplet care / Skin to Skin.  Brandi Casey Brandi Casey 03/30/2018, 5:42 PM

## 2017-09-08 LAB — OB RESULTS CONSOLE ABO/RH: RH Type: NEGATIVE

## 2017-09-08 LAB — OB RESULTS CONSOLE RPR: RPR: NONREACTIVE

## 2017-09-08 LAB — OB RESULTS CONSOLE ANTIBODY SCREEN: ANTIBODY SCREEN: NEGATIVE

## 2017-09-08 LAB — OB RESULTS CONSOLE HIV ANTIBODY (ROUTINE TESTING): HIV: NONREACTIVE

## 2017-09-08 LAB — OB RESULTS CONSOLE RUBELLA ANTIBODY, IGM: Rubella: IMMUNE

## 2017-09-08 LAB — OB RESULTS CONSOLE HEPATITIS B SURFACE ANTIGEN: HEP B S AG: NEGATIVE

## 2017-09-08 LAB — OB RESULTS CONSOLE GC/CHLAMYDIA
CHLAMYDIA, DNA PROBE: NEGATIVE
GC PROBE AMP, GENITAL: NEGATIVE

## 2018-01-27 ENCOUNTER — Encounter: Payer: BLUE CROSS/BLUE SHIELD | Attending: Obstetrics and Gynecology | Admitting: Registered"

## 2018-01-27 DIAGNOSIS — Z713 Dietary counseling and surveillance: Secondary | ICD-10-CM | POA: Diagnosis present

## 2018-01-27 DIAGNOSIS — R7309 Other abnormal glucose: Secondary | ICD-10-CM | POA: Insufficient documentation

## 2018-01-27 DIAGNOSIS — O99810 Abnormal glucose complicating pregnancy: Secondary | ICD-10-CM

## 2018-01-28 ENCOUNTER — Encounter: Payer: Self-pay | Admitting: Registered"

## 2018-01-28 DIAGNOSIS — O99810 Abnormal glucose complicating pregnancy: Secondary | ICD-10-CM | POA: Insufficient documentation

## 2018-01-28 NOTE — Progress Notes (Signed)
Patient was seen on 01/27/18 for Gestational Diabetes self-management class at the [facility]. The following learning objectives were met by the patient during this course:   States the definition of Gestational Diabetes  States why dietary management is important in controlling blood glucose  Describes the effects each nutrient has on blood glucose levels  Demonstrates ability to create a balanced meal plan  Demonstrates carbohydrate counting   States when to check blood glucose levels  Demonstrates proper blood glucose monitoring techniques  States the effect of stress and exercise on blood glucose levels  States the importance of limiting caffeine and abstaining from alcohol and smoking  Blood glucose monitor given: [employer] Flex Lot # [id number] X Exp: 01/30/19 Blood glucose reading: 57 mg/dL, RD provided glucose tablets. 15 min later 106 mg/dL. Pt states last food consumed was 5 hrs prior.  Patient instructed to monitor glucose levels: FBS: 60 - <95; 1 hour: <140; 2 hour: <120  Patient received handouts:  Nutrition Diabetes and Pregnancy, including carb counting list  Patient will be seen for follow-up as needed.

## 2018-03-17 ENCOUNTER — Encounter (HOSPITAL_COMMUNITY): Payer: Self-pay | Admitting: *Deleted

## 2018-03-17 ENCOUNTER — Telehealth (HOSPITAL_COMMUNITY): Payer: Self-pay | Admitting: *Deleted

## 2018-03-17 NOTE — Telephone Encounter (Signed)
Preadmission screen  

## 2018-03-29 ENCOUNTER — Encounter (HOSPITAL_COMMUNITY): Payer: Self-pay | Admitting: *Deleted

## 2018-03-29 ENCOUNTER — Inpatient Hospital Stay (HOSPITAL_COMMUNITY)
Admission: AD | Admit: 2018-03-29 | Discharge: 2018-04-01 | DRG: 807 | Disposition: A | Discharge status: Home / Self Care | Payer: BLUE CROSS/BLUE SHIELD | Attending: Obstetrics and Gynecology | Admitting: Obstetrics and Gynecology

## 2018-03-29 DIAGNOSIS — O894 Spinal and epidural anesthesia-induced headache during the puerperium: Secondary | ICD-10-CM | POA: Diagnosis not present

## 2018-03-29 DIAGNOSIS — Z87891 Personal history of nicotine dependence: Secondary | ICD-10-CM | POA: Diagnosis not present

## 2018-03-29 DIAGNOSIS — O1493 Unspecified pre-eclampsia, third trimester: Secondary | ICD-10-CM | POA: Diagnosis present

## 2018-03-29 DIAGNOSIS — O1002 Pre-existing essential hypertension complicating childbirth: Secondary | ICD-10-CM | POA: Diagnosis present

## 2018-03-29 DIAGNOSIS — Z6791 Unspecified blood type, Rh negative: Secondary | ICD-10-CM | POA: Diagnosis not present

## 2018-03-29 DIAGNOSIS — Z3A38 38 weeks gestation of pregnancy: Secondary | ICD-10-CM | POA: Diagnosis not present

## 2018-03-29 DIAGNOSIS — O114 Pre-existing hypertension with pre-eclampsia, complicating childbirth: Principal | ICD-10-CM | POA: Diagnosis present

## 2018-03-29 DIAGNOSIS — R030 Elevated blood-pressure reading, without diagnosis of hypertension: Secondary | ICD-10-CM | POA: Diagnosis present

## 2018-03-29 DIAGNOSIS — O24425 Gestational diabetes mellitus in childbirth, controlled by oral hypoglycemic drugs: Secondary | ICD-10-CM | POA: Diagnosis present

## 2018-03-29 DIAGNOSIS — O99214 Obesity complicating childbirth: Secondary | ICD-10-CM | POA: Diagnosis present

## 2018-03-29 DIAGNOSIS — O26893 Other specified pregnancy related conditions, third trimester: Secondary | ICD-10-CM | POA: Diagnosis present

## 2018-03-29 HISTORY — DX: Acquired absence of left leg below knee: Z89.512

## 2018-03-29 LAB — CBC
HCT: 39.8 % (ref 36.0–46.0)
HEMOGLOBIN: 13.5 g/dL (ref 12.0–15.0)
MCH: 28.1 pg (ref 26.0–34.0)
MCHC: 33.9 g/dL (ref 30.0–36.0)
MCV: 82.7 fL (ref 78.0–100.0)
Platelets: 208 10*3/uL (ref 150–400)
RBC: 4.81 MIL/uL (ref 3.87–5.11)
RDW: 16.4 % — ABNORMAL HIGH (ref 11.5–15.5)
WBC: 12.8 10*3/uL — ABNORMAL HIGH (ref 4.0–10.5)

## 2018-03-29 LAB — PROTEIN / CREATININE RATIO, URINE
CREATININE, URINE: 106 mg/dL
Protein Creatinine Ratio: 1.62 mg/mg{Cre} — ABNORMAL HIGH (ref 0.00–0.15)
TOTAL PROTEIN, URINE: 172 mg/dL

## 2018-03-29 LAB — COMPREHENSIVE METABOLIC PANEL
ALK PHOS: 111 U/L (ref 38–126)
ALT: 12 U/L (ref 0–44)
AST: 18 U/L (ref 15–41)
Albumin: 2.6 g/dL — ABNORMAL LOW (ref 3.5–5.0)
Anion gap: 9 (ref 5–15)
BILIRUBIN TOTAL: 0.1 mg/dL — AB (ref 0.3–1.2)
BUN: 13 mg/dL (ref 6–20)
CALCIUM: 9.3 mg/dL (ref 8.9–10.3)
CO2: 20 mmol/L — ABNORMAL LOW (ref 22–32)
Chloride: 105 mmol/L (ref 98–111)
Creatinine, Ser: 0.6 mg/dL (ref 0.44–1.00)
Glucose, Bld: 89 mg/dL (ref 70–99)
Potassium: 4.6 mmol/L (ref 3.5–5.1)
SODIUM: 134 mmol/L — AB (ref 135–145)
TOTAL PROTEIN: 6.1 g/dL — AB (ref 6.5–8.1)

## 2018-03-29 LAB — URINALYSIS, ROUTINE W REFLEX MICROSCOPIC
Bilirubin Urine: NEGATIVE
Glucose, UA: NEGATIVE mg/dL
Hgb urine dipstick: NEGATIVE
Ketones, ur: NEGATIVE mg/dL
Leukocytes, UA: NEGATIVE
Nitrite: NEGATIVE
PH: 6 (ref 5.0–8.0)
Protein, ur: 100 mg/dL — AB
SPECIFIC GRAVITY, URINE: 1.02 (ref 1.005–1.030)

## 2018-03-29 LAB — TYPE AND SCREEN
ABO/RH(D): O NEG
Antibody Screen: NEGATIVE

## 2018-03-29 LAB — GLUCOSE, CAPILLARY: GLUCOSE-CAPILLARY: 95 mg/dL (ref 70–99)

## 2018-03-29 MED ORDER — TERBUTALINE SULFATE 1 MG/ML IJ SOLN
0.2500 mg | Freq: Once | INTRAMUSCULAR | Status: DC | PRN
  Filled 2018-03-29: qty 1

## 2018-03-29 MED ORDER — OXYCODONE-ACETAMINOPHEN 5-325 MG PO TABS: 2.0000 | ORAL_TABLET | ORAL | Status: DC | PRN

## 2018-03-29 MED ORDER — SOD CITRATE-CITRIC ACID 500-334 MG/5ML PO SOLN: 30.0000 mL | ORAL | Status: DC | PRN

## 2018-03-29 MED ORDER — MISOPROSTOL 25 MCG QUARTER TABLET
25.0000 ug | ORAL_TABLET | ORAL | Status: DC | PRN
  Administered 2018-03-29 – 2018-03-30 (×3): 25 ug via VAGINAL
  Filled 2018-03-29 (×4): qty 1

## 2018-03-29 MED ORDER — OXYTOCIN 40 UNITS IN LACTATED RINGERS INFUSION - SIMPLE MED: 2.5000 [IU]/h | INTRAVENOUS | Status: DC

## 2018-03-29 MED ORDER — FLEET ENEMA 7-19 GM/118ML RE ENEM: 1.0000 | ENEMA | RECTAL | Status: DC | PRN

## 2018-03-29 MED ORDER — LIDOCAINE HCL (PF) 1 % IJ SOLN
30.0000 mL | INTRAMUSCULAR | Status: DC | PRN
Start: 2018-03-29 — End: 2018-04-01
  Administered 2018-03-30: 30 mL via SUBCUTANEOUS
  Filled 2018-03-29: qty 30

## 2018-03-29 MED ORDER — ZOLPIDEM TARTRATE 5 MG PO TABS: 5.0000 mg | ORAL_TABLET | Freq: Every evening | ORAL | Status: DC | PRN

## 2018-03-29 MED ORDER — LACTATED RINGERS IV SOLN: 500.0000 mL | INTRAVENOUS | Status: DC | PRN

## 2018-03-29 MED ORDER — FENTANYL CITRATE (PF) 100 MCG/2ML IJ SOLN: 50.0000 ug | INTRAMUSCULAR | Status: DC | PRN

## 2018-03-29 MED ORDER — OXYCODONE-ACETAMINOPHEN 5-325 MG PO TABS: 1.0000 | ORAL_TABLET | ORAL | Status: DC | PRN

## 2018-03-29 MED ORDER — LACTATED RINGERS IV SOLN
INTRAVENOUS | Status: DC
  Administered 2018-03-29 – 2018-03-30 (×3): via INTRAVENOUS

## 2018-03-29 MED ORDER — OXYTOCIN BOLUS FROM INFUSION
500.0000 mL | Freq: Once | INTRAVENOUS | Status: AC
  Administered 2018-03-30: 500 mL via INTRAVENOUS

## 2018-03-29 MED ORDER — ONDANSETRON HCL 4 MG/2ML IJ SOLN: 4.0000 mg | Freq: Four times a day (QID) | INTRAMUSCULAR | Status: DC | PRN

## 2018-03-29 MED ORDER — ACETAMINOPHEN 325 MG PO TABS: 650.0000 mg | ORAL_TABLET | ORAL | Status: DC | PRN

## 2018-03-29 NOTE — H&P (Signed)
Brandi Casey is a 27 y.o. female presenting for evaluation of elevated blood pressures and new proteinuria noted in office today.  The patient reports no HA, CP/SOB, RUQ pain, or visual disturbance.  On arrival to MAU, BPs are in the normal to mild range, labs are wnl except P:C 1.62.  The patient has significant PMH for likely CHTN and A2DM on Glyburide with good control.  Last u/s 7/11 shows EFW 6#12 (61%).  Also, the patient has congenital amputation of the left leg.  RH negative.  GBS negative.  OB History    Gravida  1   Para  0   Term  0   Preterm  0   AB  0   Living  0     SAB  0   TAB  0   Ectopic  0   Multiple  0   Live Births  0          Past Medical History:  Diagnosis Date  . 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 Medical history non-contributory    Past Surgical History:  Procedure Laterality Date  . KNEE SURGERY    . LEG AMPUTATION    . prostectic leg lt     Family History: family history includes Alcohol abuse in her mother; Anxiety disorder in her maternal grandmother and mother; Depression in her maternal grandmother and mother; Drug abuse in her mother; Hypertension in her maternal grandmother and mother; Rheum arthritis in her mother; Thyroid disease in her maternal grandmother. Social History:  reports that she has quit smoking. Her smoking use included e-cigarettes and cigarettes. She has never used smokeless tobacco. She reports that she has current or past drug history. Drug: Marijuana. She reports that she does not drink alcohol.     Maternal Diabetes: Yes:  Diabetes Type:  Insulin/Medication controlled Genetic Screening: Normal Maternal Ultrasounds/Referrals: Normal Fetal Ultrasounds or other Referrals:  None Maternal Substance Abuse:  No Significant Maternal Medications:  Meds include: Other: Glyburide Significant Maternal Lab Results:  Lab values include: Group B Strep negative, Rh  negative Other Comments:  None  ROS Maternal Medical History:  Fetal activity: Perceived fetal activity is normal.   Last perceived fetal movement was within the past hour.    Prenatal complications: PIH and pre-eclampsia.   Prenatal Complications - Diabetes: gestational. Diabetes is managed by oral agent (monotherapy).        Blood pressure (!) 146/77, pulse 88, temperature 97.6 F (36.4 C), temperature source Oral, resp. rate 18, weight 277 lb 3 oz (125.7 kg), SpO2 98 %. Maternal Exam:  Uterine Assessment: Contraction strength is mild.  Contraction frequency is rare.   Abdomen: Patient reports no abdominal tenderness. Fundal height is c/w dates.   Estimated fetal weight is 7#8.   Fetal presentation: vertex  Introitus: Normal vulva. Pelvis: adequate for delivery.   Cervix: Cervix evaluated by digital exam.     Physical Exam  Constitutional: She is oriented to person, place, and time. She appears well-developed and well-nourished.  GI: Soft. There is no rebound.  Musculoskeletal: Normal range of motion. She exhibits no edema.  Neurological: She is alert and oriented to person, place, and time. She has normal reflexes.  Skin: Skin is warm and dry.  Psychiatric: She has a normal mood and affect. Her behavior is normal.    Prenatal labs: ABO, Rh: O/Negative/-- (01/08 0000) Antibody: Negative (01/08 0000) Rubella: Immune (01/08 0000) RPR:  Nonreactive (01/08 0000)  HBsAg: Negative (01/08 0000)  HIV: Non-reactive (01/08 0000)  GBS:     Assessment/Plan: 26yo G1 at [redacted]w[redacted]d with superimposed pre-eclampsia without severe features -Plan VMP -IV antihypertensives and magnesium sulfate prn persistent severe range BPs -Pain management prn -A2DM-CBG q 4 -Anticipate NSVD  Brandi Casey 03/29/2018, 8:20 PM

## 2018-03-29 NOTE — MAU Provider Note (Signed)
History     CSN: [id number]  Arrival date and time: 03/29/18 1703   First Provider Initiated Contact with Patient 03/29/18 1925      Chief Complaint  Patient presents with  . Proteinuria  . Hypertension   HPI   Brandi Casey is a 27 y.o. female G1P0000 @ 72w5dhere in MAU with elevated BP. Says she was in the office today and had a elevated BP of 1580systolic. She says this is not the first time her BP has been elevated in the office. She declines elevated BP prior to pregnancy. + fetal movement.   OB History    Gravida  1   Para  0   Term  0   Preterm  0   AB  0   Living  0     SAB  0   TAB  0   Ectopic  0   Multiple  0   Live Births  0           Past Medical History:  Diagnosis Date  . 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 Medical history non-contributory     Past Surgical History:  Procedure Laterality Date  . KNEE SURGERY    . LEG AMPUTATION    . prostectic leg lt      Family History  Problem Relation Age of Onset  . Hypertension Mother   . Rheum arthritis Mother   . Anxiety disorder Mother   . Depression Mother   . Alcohol abuse Mother   . Drug abuse Mother   . Hypertension Maternal Grandmother   . Thyroid disease Maternal Grandmother   . Depression Maternal Grandmother   . Anxiety disorder Maternal Grandmother     Social History   Tobacco Use  . Smoking status: Former Smoker    Types: E-cigarettes, Cigarettes  . Smokeless tobacco: Never Used  Substance Use Topics  . Alcohol use: No  . Drug use: Yes    Types: Marijuana    Comment: last used week of 03/01/18    Allergies: No Known Allergies  Medications Prior to Admission  Medication Sig Dispense Refill Last Dose  . norgestimate-ethinyl estradiol (ORTHO-CYCLEN,SPRINTEC,PREVIFEM) 0.25-35 MG-MCG tablet Take 1 tablet by mouth daily. 1 Package 11    Results for orders placed or performed during the hospital encounter of  03/29/18 (from the past 48 hour(s))  CBC     Status: Abnormal   Collection Time: 03/29/18  5:34 PM  Result Value Ref Range   WBC 12.8 (H) 4.0 - 10.5 K/uL   RBC 4.81 3.87 - 5.11 MIL/uL   Hemoglobin 13.5 12.0 - 15.0 g/dL   HCT 39.8 36.0 - 46.0 %   MCV 82.7 78.0 - 100.0 fL   MCH 28.1 26.0 - 34.0 pg   MCHC 33.9 30.0 - 36.0 g/dL   RDW 16.4 (H) 11.5 - 15.5 %   Platelets 208 150 - 400 K/uL    Comment: Performed at [facility] [address], [place] [address] Comprehensive metabolic panel     Status: Abnormal   Collection Time: 03/29/18  5:34 PM  Result Value Ref Range   Sodium 134 (L) 135 - 145 mmol/L   Potassium 4.6 3.5 - 5.1 mmol/L   Chloride 105 98 - 111 mmol/L   CO2 20 (L) 22 - 32 mmol/L   Glucose, Bld 89 70 - 99 mg/dL   BUN 13 6 -  20 mg/dL   Creatinine, Ser 0.60 0.44 - 1.00 mg/dL   Calcium 9.3 8.9 - 10.3 mg/dL   Total Protein 6.1 (L) 6.5 - 8.1 g/dL   Albumin 2.6 (L) 3.5 - 5.0 g/dL   AST 18 15 - 41 U/L   ALT 12 0 - 44 U/L   Alkaline Phosphatase 111 38 - 126 U/L   Total Bilirubin 0.1 (L) 0.3 - 1.2 mg/dL   GFR calc non Af Amer >60 >60 mL/min   GFR calc Af Amer >60 >60 mL/min    Comment: (NOTE) The eGFR has been calculated using the CKD EPI equation. This calculation has not been validated in all clinical situations. eGFR's persistently <60 mL/min signify possible Chronic Kidney Disease.    Anion gap 9 5 - 15    Comment: Performed at [facility], [address], [place], [place] [address]  Protein / creatinine ratio, urine     Status: Abnormal   Collection Time: 03/29/18  6:05 PM  Result Value Ref Range   Creatinine, Urine 106.00 mg/dL   Total Protein, Urine 172 mg/dL    Comment: NO NORMAL RANGE ESTABLISHED FOR THIS TEST RESULTS CONFIRMED BY MANUAL DILUTION    Protein Creatinine Ratio 1.62 (H) 0.00 - 0.15 mg/mg[Cre]    Comment: Performed at [facility], [address] [place], [place] [address]  Urinalysis, Routine w reflex microscopic      Status: Abnormal   Collection Time: 03/29/18  6:05 PM  Result Value Ref Range   Color, Urine YELLOW YELLOW   APPearance HAZY (A) CLEAR   Specific Gravity, Urine 1.020 1.005 - 1.030   pH 6.0 5.0 - 8.0   Glucose, UA NEGATIVE NEGATIVE mg/dL   Hgb urine dipstick NEGATIVE NEGATIVE   Bilirubin Urine NEGATIVE NEGATIVE   Ketones, ur NEGATIVE NEGATIVE mg/dL   Protein, ur 100 (A) NEGATIVE mg/dL   Nitrite NEGATIVE NEGATIVE   Leukocytes, UA NEGATIVE NEGATIVE   RBC / HPF 0-5 0 - 5 RBC/hpf   WBC, UA 0-5 0 - 5 WBC/hpf   Bacteria, UA RARE (A) NONE SEEN   Squamous Epithelial / LPF 0-5 0 - 5   Mucus PRESENT     Comment: Performed at [facility], [address], [place], [place] [address]   Review of Systems  Constitutional: Negative for fever.  Eyes: Negative for photophobia and visual disturbance.  Gastrointestinal: Negative for abdominal pain.  Neurological: Negative for headaches.   Physical Exam   Blood pressure 133/76, pulse 88, temperature 97.6 F (36.4 C), temperature source Oral, resp. rate 18, weight 277 lb 3 oz (125.7 kg), SpO2 98 %.   Patient Vitals for the past 24 hrs:  BP Temp Temp src Pulse Resp SpO2 Weight  03/29/18 1931 (!) 148/87 - - 92 - - -  03/29/18 1916 (!) 147/90 - - (!) 102 - - -  03/29/18 1900 133/76 - - 88 - - -  03/29/18 1845 (!) 142/71 - - 95 - - -  03/29/18 1830 138/69 - - 91 - - -  03/29/18 1815 134/71 - - 85 - - -  03/29/18 1800 140/78 - - 86 - - -  03/29/18 1745 132/74 - - 91 - - -  03/29/18 1727 (!) 151/68 97.6 F (36.4 C) Oral 85 18 98 % -  03/29/18 1721 - - - - - - 277 lb 3 oz (125.7 kg)   Physical Exam  Constitutional: She is oriented to person, place, and time. She appears well-developed and well-nourished.  No distress.  HENT:  Head: Normocephalic.  Eyes: Pupils are equal, round, and reactive to light.  Cardiovascular: Normal rate.  Respiratory: Effort normal and breath sounds normal.  GI: Soft. She exhibits no distension. There is no  tenderness. There is no rebound.  Musculoskeletal: Normal range of motion.  Neurological: She is alert and oriented to person, place, and time. She has normal reflexes. She displays normal reflexes.  Negative clonus   Skin: Skin is warm. She is not diaphoretic.  Psychiatric: Her behavior is normal.   Fetal Tracing: Baseline: 120 bpm Variability: Moderate  Accelerations: 15x15 Decelerations: None Toco: Occasional   MAU Course  Procedures  None  MDM  PIH labs ordered Discussed labs with Dr. Lynnette Caffey, discussed labs, vitals, and fetal tracing. Admit to BS for induction of labor.   Assessment and Plan   A:  1. Preeclampsia, third trimester     P:  Admit to BS Orders placed by Dr. Lynnette Caffey Discussed plan of care with Dr. Andy Gauss, Artist Pais, NP 03/29/2018 7:53 PM

## 2018-03-29 NOTE — MAU Note (Signed)
Assumed care of patient at 1900 

## 2018-03-29 NOTE — MAU Note (Signed)
Patient was seen in the office today with elevated BP 150s systolic.  +3 Protein in urine.  Denies HA, visual changes, epigastic pain.  Here for evaluation.

## 2018-03-30 ENCOUNTER — Inpatient Hospital Stay (HOSPITAL_COMMUNITY): Payer: BLUE CROSS/BLUE SHIELD | Admitting: Anesthesiology

## 2018-03-30 ENCOUNTER — Encounter (HOSPITAL_COMMUNITY): Payer: Self-pay | Admitting: *Deleted

## 2018-03-30 LAB — GLUCOSE, CAPILLARY
Glucose-Capillary: 88 mg/dL (ref 70–99)
Glucose-Capillary: 113 mg/dL — ABNORMAL HIGH (ref 70–99)

## 2018-03-30 LAB — CBC
HEMATOCRIT: 39.6 % (ref 36.0–46.0)
HEMATOCRIT: 40 % (ref 36.0–46.0)
HEMOGLOBIN: 13.4 g/dL (ref 12.0–15.0)
HEMOGLOBIN: 13.7 g/dL (ref 12.0–15.0)
MCH: 27.9 pg (ref 26.0–34.0)
MCH: 28.1 pg (ref 26.0–34.0)
MCHC: 33.8 g/dL (ref 30.0–36.0)
MCHC: 34.3 g/dL (ref 30.0–36.0)
MCV: 82.3 fL (ref 78.0–100.0)
MCV: 82.1 fL (ref 78.0–100.0)
PLATELETS: 247 10*3/uL (ref 150–400)
Platelets: 205 10*3/uL (ref 150–400)
RBC: 4.81 MIL/uL (ref 3.87–5.11)
RBC: 4.87 MIL/uL (ref 3.87–5.11)
RDW: 16.3 % — AB (ref 11.5–15.5)
RDW: 16.1 % — ABNORMAL HIGH (ref 11.5–15.5)
WBC: 12.6 10*3/uL — ABNORMAL HIGH (ref 4.0–10.5)
WBC: 24.8 10*3/uL — AB (ref 4.0–10.5)

## 2018-03-30 LAB — ABO/RH: ABO/RH(D): O NEG

## 2018-03-30 LAB — RPR: RPR Ser Ql: NONREACTIVE

## 2018-03-30 MED ORDER — DIBUCAINE 1 % RE OINT: 1.0000 "application " | TOPICAL_OINTMENT | RECTAL | Status: DC | PRN

## 2018-03-30 MED ORDER — DIPHENHYDRAMINE HCL 25 MG PO CAPS: 25.0000 mg | ORAL_CAPSULE | Freq: Four times a day (QID) | ORAL | Status: DC | PRN

## 2018-03-30 MED ORDER — SENNOSIDES-DOCUSATE SODIUM 8.6-50 MG PO TABS
2.0000 | ORAL_TABLET | ORAL | Status: DC
  Administered 2018-03-31 (×2): 2 via ORAL
  Filled 2018-03-30 (×2): qty 2

## 2018-03-30 MED ORDER — PHENYLEPHRINE 40 MCG/ML (10ML) SYRINGE FOR IV PUSH (FOR BLOOD PRESSURE SUPPORT)
80.0000 ug | PREFILLED_SYRINGE | INTRAVENOUS | Status: DC | PRN
  Filled 2018-03-30 (×2): qty 10
  Filled 2018-03-30: qty 5

## 2018-03-30 MED ORDER — WITCH HAZEL-GLYCERIN EX PADS: 1.0000 "application " | MEDICATED_PAD | CUTANEOUS | Status: DC | PRN

## 2018-03-30 MED ORDER — BENZOCAINE-MENTHOL 20-0.5 % EX AERO
1.0000 "application " | INHALATION_SPRAY | CUTANEOUS | Status: DC | PRN
  Administered 2018-03-30 – 2018-04-01 (×2): 1 via TOPICAL
  Filled 2018-03-30 (×2): qty 56

## 2018-03-30 MED ORDER — ZOLPIDEM TARTRATE 5 MG PO TABS: 5.0000 mg | ORAL_TABLET | Freq: Every evening | ORAL | Status: DC | PRN

## 2018-03-30 MED ORDER — EPHEDRINE 5 MG/ML INJ
10.0000 mg | INTRAVENOUS | Status: DC | PRN
  Filled 2018-03-30: qty 2

## 2018-03-30 MED ORDER — OXYTOCIN 40 UNITS IN LACTATED RINGERS INFUSION - SIMPLE MED
1.0000 m[IU]/min | INTRAVENOUS | Status: DC
  Administered 2018-03-30: 2 m[IU]/min via INTRAVENOUS
  Filled 2018-03-30: qty 1000

## 2018-03-30 MED ORDER — TETANUS-DIPHTH-ACELL PERTUSSIS 5-2.5-18.5 LF-MCG/0.5 IM SUSP: 0.5000 mL | Freq: Once | INTRAMUSCULAR | Status: DC

## 2018-03-30 MED ORDER — FENTANYL 2.5 MCG/ML BUPIVACAINE 1/10 % EPIDURAL INFUSION (WH - ANES)
14.0000 mL/h | INTRAMUSCULAR | Status: DC | PRN
  Administered 2018-03-30: 14 mL/h via EPIDURAL
  Filled 2018-03-30: qty 100

## 2018-03-30 MED ORDER — ACETAMINOPHEN 325 MG PO TABS
650.0000 mg | ORAL_TABLET | ORAL | Status: DC | PRN
Start: 2018-03-30 — End: 2018-04-01

## 2018-03-30 MED ORDER — DIPHENHYDRAMINE HCL 50 MG/ML IJ SOLN: 12.5000 mg | INTRAMUSCULAR | Status: DC | PRN

## 2018-03-30 MED ORDER — SIMETHICONE 80 MG PO CHEW: 80.0000 mg | CHEWABLE_TABLET | ORAL | Status: DC | PRN

## 2018-03-30 MED ORDER — LACTATED RINGERS IV SOLN: 500.0000 mL | Freq: Once | INTRAVENOUS | Status: DC

## 2018-03-30 MED ORDER — TERBUTALINE SULFATE 1 MG/ML IJ SOLN: 0.2500 mg | Freq: Once | INTRAMUSCULAR | Status: DC | PRN

## 2018-03-30 MED ORDER — OXYCODONE HCL 5 MG PO TABS: 10.0000 mg | ORAL_TABLET | ORAL | Status: DC | PRN

## 2018-03-30 MED ORDER — PHENYLEPHRINE 40 MCG/ML (10ML) SYRINGE FOR IV PUSH (FOR BLOOD PRESSURE SUPPORT)
80.0000 ug | PREFILLED_SYRINGE | INTRAVENOUS | Status: DC | PRN
  Filled 2018-03-30: qty 5

## 2018-03-30 MED ORDER — PRENATAL MULTIVITAMIN CH
1.0000 | ORAL_TABLET | Freq: Every day | ORAL | Status: DC
  Administered 2018-03-31 – 2018-04-01 (×2): 1 via ORAL
  Filled 2018-03-30 (×2): qty 1

## 2018-03-30 MED ORDER — LIDOCAINE-EPINEPHRINE (PF) 2 %-1:200000 IJ SOLN
INTRAMUSCULAR | Status: DC | PRN
  Administered 2018-03-30 (×2): 5 mL via EPIDURAL

## 2018-03-30 MED ORDER — ONDANSETRON HCL 4 MG PO TABS: 4.0000 mg | ORAL_TABLET | ORAL | Status: DC | PRN

## 2018-03-30 MED ORDER — OXYCODONE HCL 5 MG PO TABS: 5.0000 mg | ORAL_TABLET | ORAL | Status: DC | PRN

## 2018-03-30 MED ORDER — LIDOCAINE HCL (PF) 1 % IJ SOLN
INTRAMUSCULAR | Status: DC | PRN
  Administered 2018-03-30: 7 mL via EPIDURAL
  Administered 2018-03-30: 3 mL via EPIDURAL
  Administered 2018-03-30: 6 mL via EPIDURAL
  Administered 2018-03-30: 7 mL via EPIDURAL

## 2018-03-30 MED ORDER — ONDANSETRON HCL 4 MG/2ML IJ SOLN: 4.0000 mg | INTRAMUSCULAR | Status: DC | PRN

## 2018-03-30 MED ORDER — BUPIVACAINE HCL (PF) 0.25 % IJ SOLN
INTRAMUSCULAR | Status: DC | PRN
  Administered 2018-03-30: 3 mL via EPIDURAL

## 2018-03-30 MED ORDER — COCONUT OIL OIL: 1.0000 "application " | TOPICAL_OIL | Status: DC | PRN

## 2018-03-30 MED ORDER — IBUPROFEN 600 MG PO TABS
600.0000 mg | ORAL_TABLET | Freq: Four times a day (QID) | ORAL | Status: DC
  Administered 2018-03-31 – 2018-04-01 (×7): 600 mg via ORAL
  Filled 2018-03-30 (×7): qty 1

## 2018-03-30 NOTE — Progress Notes (Signed)
IUPC placed s/s inability to trace ctxn. 4cm at that time. Now 5cm. CTM closely.

## 2018-03-30 NOTE — Progress Notes (Signed)
C/c/+2 push 

## 2018-03-30 NOTE — Anesthesia Procedure Notes (Addendum)
Epidural Patient location during procedure: OB Start time: 03/30/2018 2:23 PM End time: 03/30/2018 2:30 PM  Staffing Anesthesiologist: Leilani AbleHatchett, Romualdo Prosise, MD Performed: anesthesiologist   Preanesthetic Checklist Completed: patient identified, site marked, surgical consent, pre-op evaluation, timeout performed, IV checked, risks and benefits discussed and monitors and equipment checked  Epidural Patient position: sitting Prep: site prepped and draped and DuraPrep Patient monitoring: continuous pulse ox and blood pressure Approach: midline Location: L4-L5 Injection technique: LOR air  Needle:  Needle type: Tuohy  Needle gauge: 17 G Needle length: 9 cm and 9 Needle insertion depth: 9 cm Catheter type: closed end flexible Catheter size: 19 Gauge Catheter at skin depth: 15 cm Test dose: positive and Other  Assessment Sensory level: T10 Events: blood not aspirated, injection not painful, no injection resistance, negative IV test and no paresthesia  Additional Notes Wet tap . Needle pulled back to - CSF and cathter inseerted into the epidural space. +CSF on aspiration of the epidural catheter. Patient immediately aware of the complication and the risk of a PDPH.Reason for block:procedure for pain

## 2018-03-30 NOTE — Progress Notes (Signed)
S: No c/o - denies PIH s/s.   O: AF, BP mostly normal to mild range SVE 1.5/50/-2, AROM for clear fluid  A/P: 27 yo G1P0 @ 38.6wga presenting for IOL s/s SIPE w/out severe features.   # SIPE - h/o cHTN w/new onset proteinuria. No s/s severe dz. Labs WNL except UPC 1.62. CTM BP  # A2GDM - cont BS per L&D protocol  # IOL - s/p cytotec x 3, AROM for clear fluid, pitocin 4 hours after last cytotec. No GBS RFs.   Rosie FateE Brandi Goette MD

## 2018-03-30 NOTE — Anesthesia Preprocedure Evaluation (Signed)
Anesthesia Evaluation  Patient identified by MRN, date of birth, ID band Patient awake    Reviewed: Allergy & Precautions, H&P , NPO status , Patient's Chart, lab work & pertinent test results  Airway Mallampati: III  TM Distance: >3 FB Neck ROM: full    Dental no notable dental hx. (+) Teeth Intact   Pulmonary former smoker,    Pulmonary exam normal breath sounds clear to auscultation       Cardiovascular hypertension, Normal cardiovascular exam Rhythm:regular Rate:Normal     Neuro/Psych negative neurological ROS  negative psych ROS   GI/Hepatic negative GI ROS, Neg liver ROS,   Endo/Other  Morbid obesity  Renal/GU negative Renal ROS     Musculoskeletal   Abdominal (+) + obese,   Peds  Hematology negative hematology ROS (+)   Anesthesia Other Findings   Reproductive/Obstetrics (+) Pregnancy                             Anesthesia Physical Anesthesia Plan  ASA: III  Anesthesia Plan: Epidural   Post-op Pain Management:    Induction:   PONV Risk Score and Plan:   Airway Management Planned:   Additional Equipment:   Intra-op Plan:   Post-operative Plan:   Informed Consent: I have reviewed the patients History and Physical, chart, labs and discussed the procedure including the risks, benefits and alternatives for the proposed anesthesia with the patient or authorized representative who has indicated his/her understanding and acceptance.     Plan Discussed with:   Anesthesia Plan Comments:         Anesthesia Quick Evaluation

## 2018-03-30 NOTE — Anesthesia Pain Management Evaluation Note (Signed)
  CRNA Pain Management Visit Note  Patient: Brandi Casey, 27 y.o., female  "Hello I am a member of the anesthesia team at [facility]. We have an anesthesia team available at all times to provide care throughout the hospital, including epidural management and anesthesia for C-section. I don't know your plan for the delivery whether it a natural birth, water birth, IV sedation, nitrous supplementation, doula or epidural, but we want to meet your pain goals."   1.Was your pain managed to your expectations on prior hospitalizations?   No prior hospitalizations  2.What is your expectation for pain management during this hospitalization?     Epidural  3.How can we help you reach that goal? Planning on epidural  Record the patient's initial score and the patient's pain goal.   Pain: 0  Pain Goal: 4 [facility] wants you to be able to say your pain was always managed very well.  Brandi Casey 03/30/2018

## 2018-03-30 NOTE — Anesthesia Procedure Notes (Signed)
Epidural Patient location during procedure: OB Start time: 03/30/2018 11:03 AM End time: 03/30/2018 11:07 AM  Staffing Anesthesiologist: Leilani AbleHatchett, Bader Stubblefield, MD Performed: anesthesiologist   Preanesthetic Checklist Completed: patient identified, site marked, surgical consent, pre-op evaluation, timeout performed, IV checked, risks and benefits discussed and monitors and equipment checked  Epidural Patient position: sitting Prep: site prepped and draped and DuraPrep Patient monitoring: continuous pulse ox and blood pressure Approach: midline Location: L3-L4 Injection technique: LOR air  Needle:  Needle type: Tuohy  Needle gauge: 17 G Needle length: 9 cm and 9 Needle insertion depth: 9 cm Catheter type: closed end flexible Catheter size: 19 Gauge Catheter at skin depth: 15 cm Test dose: negative and Other  Assessment Sensory level: T9 Events: blood not aspirated, injection not painful, no injection resistance, negative IV test and no paresthesia  Additional Notes Reason for block:procedure for pain

## 2018-03-30 NOTE — Progress Notes (Signed)
   03/30/18 2209  Vital Signs  BP (!) 145/94  BP Location Right Arm  Patient Position (if appropriate) Sitting  BP Method Automatic  Pulse Rate (!) 138   Patient up for the first time using the bathroom and after urinating she stood up and started to feel very light headed. Got patient back in bed and her BP and pulse were elevated. No headache or blurred vision. RN checked patient's bleeding and there was a small amount noted. Patient stated she started to feel better once she sat down. Dr. Elon SpannerLeger notified and gave RN parameters when to call about BP.

## 2018-03-31 ENCOUNTER — Inpatient Hospital Stay (HOSPITAL_COMMUNITY)
Admission: RE | Admit: 2018-03-31 | Discharge: 2018-03-31 | Disposition: A | Discharge status: Home / Self Care | Payer: BLUE CROSS/BLUE SHIELD | Source: Ambulatory Visit | Attending: Obstetrics and Gynecology | Admitting: Obstetrics and Gynecology

## 2018-03-31 LAB — CBC
HCT: 33.9 % — ABNORMAL LOW (ref 36.0–46.0)
HEMOGLOBIN: 11.4 g/dL — AB (ref 12.0–15.0)
MCH: 27.7 pg (ref 26.0–34.0)
MCHC: 33.6 g/dL (ref 30.0–36.0)
MCV: 82.3 fL (ref 78.0–100.0)
Platelets: 204 10*3/uL (ref 150–400)
RBC: 4.12 MIL/uL (ref 3.87–5.11)
RDW: 16.6 % — ABNORMAL HIGH (ref 11.5–15.5)
WBC: 18.7 10*3/uL — AB (ref 4.0–10.5)

## 2018-03-31 MED ORDER — ATROPINE SULFATE 1 MG/ML IJ SOLN
1.0000 mg | Freq: Once | INTRAMUSCULAR | Status: AC
  Administered 2018-03-31: 1 mg via INTRAVENOUS

## 2018-03-31 MED ORDER — NEOSTIGMINE METHYLSULFATE 10 MG/10ML IV SOLN
2.0000 mg | Freq: Once | INTRAVENOUS | Status: AC
  Administered 2018-03-31: 2 mg via INTRAVENOUS

## 2018-03-31 MED ORDER — NEOSTIGMINE METHYLSULFATE 10 MG/10ML IV SOLN
INTRAVENOUS | Status: AC
  Filled 2018-03-31: qty 1

## 2018-03-31 MED ORDER — BUTALBITAL-APAP-CAFFEINE 50-325-40 MG PO TABS
2.0000 | ORAL_TABLET | Freq: Three times a day (TID) | ORAL | Status: DC | PRN
  Administered 2018-03-31 (×2): 2 via ORAL
  Filled 2018-03-31 (×2): qty 2

## 2018-03-31 MED ORDER — SODIUM CHLORIDE 0.9 % IJ SOLN
INTRAMUSCULAR | Status: AC
  Filled 2018-03-31: qty 10

## 2018-03-31 MED ORDER — NEOSTIGMINE METHYLSULFATE 10 MG/10ML IV SOLN
1.0000 mg | Freq: Once | INTRAVENOUS | Status: AC
  Administered 2018-03-31: 1 mg via INTRAVENOUS
  Filled 2018-03-31: qty 1

## 2018-03-31 MED ORDER — ATROPINE SULFATE 1 MG/ML IJ SOLN
1.0000 mg | Freq: Once | INTRAMUSCULAR | Status: AC
  Administered 2018-03-31: 1 mg via INTRAVENOUS
  Filled 2018-03-31: qty 1

## 2018-03-31 MED ORDER — LACTATED RINGERS IV BOLUS
1000.0000 mL | Freq: Once | INTRAVENOUS | Status: AC
  Administered 2018-03-31: 1000 mL via INTRAVENOUS

## 2018-03-31 NOTE — Anesthesia Postprocedure Evaluation (Signed)
Anesthesia Post Note  Patient: Brandi LevelsDestiny N Casey  Procedure(s) Performed: AN AD HOC LABOR EPIDURAL     Patient location during evaluation: Mother Baby Anesthesia Type: Epidural Level of consciousness: awake and alert Pain management: pain level controlled Vital Signs Assessment: post-procedure vital signs reviewed and stable Respiratory status: spontaneous breathing Cardiovascular status: stable Postop Assessment: no headache, patient able to bend at knees, no backache, no apparent nausea or vomiting, epidural receding, adequate PO intake and able to ambulate Anesthetic complications: no    Last Vitals:  Vitals:   03/31/18 0532 03/31/18 0751  BP: 125/84 (!) 127/58  Pulse: (!) 104 92  Resp: 16 16  Temp: 36.4 C 36.6 C  SpO2: 99% 100%    Last Pain:  Vitals:   03/31/18 0750  TempSrc:   PainSc: 4    Pain Goal:                 Salome ArntSterling, Rhett Mutschler Marie

## 2018-03-31 NOTE — Clinical Social Work Maternal (Signed)
CLINICAL SOCIAL WORK MATERNAL/CHILD NOTE  Patient Details  Name: Brandi Casey MRN: [id number] Date of Birth: 05/14/1991  Date:  12/19/17  Clinical Social Worker Initiating Note:  Laurey Arrow Date/Time: Initiated:  03/31/18/1238     Child's Name:  Brandi Casey   Biological Parents:  Mother, Father   Need for Interpreter:  None   Reason for Referral:  [place] Concerns(EDPS score 10 and hx of marijuana use. 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   Strengths:  Ability to meet basic needs , Pediatrician chosen, Home prepared for child    Psychotropic Medications:         Pediatrician:    [facility] area  Pediatrician List:   [facility]      Pediatrician Fax Number:    Risk Factors/Current Problems:  Substance Use , Mental Health Concerns    Cognitive State:  Able to Concentrate , Alert , Insightful , Linear Thinking , Goal Oriented    Mood/Affect:  Bright , Interested , Happy , Relaxed ,  Comfortable    CSW Assessment: CSW met with MOB in room 118 to complete an assessment for Edinburgh score of 10 and hx of marijuana use. When CSW arrived, MOB was resting in bed and with MOB's permission CSW asked FOB to leave the room; infant was being assessed for pediatrician. MOB was polite, forthcoming, and easy to engage.  CSW reviewed MOB's results from the [place] and MOB openly shared feelings of being overwhelmed an anxious.  CSW validated and normalized MOB's thoughts and feelings.CSW provided education regarding the baby blues period vs. perinatal mood disorders, discussed treatment and gave resources for mental health follow up if concerns arise.  CSW recommends self-evaluation during the postpartum time period using the New Mom Checklist from Postpartum Progress and encouraged MOB to contact a medical professional if symptoms are noted at any time. CSW presented with insight and awareness and did not demonstrate any acute signs or symptoms.  CSW assessed for safety and MOB denied SI, HI, and DV.    CSW asked about MOB's marijuana use an MOB openly shared the use of marijuana throughout pregnancy.  MOB denied the use of all other illicit substance and reported that  MOB smoked marijuana to assist with reducing MOB's anxiety.  CSW offered outpatient counseling resources for anxiety and MOB declined. CSW informed MOB of hospital's drug screen policy and MOB was understanding.  MOB is aware that infant's UDS was negative and that CSW will monitor infant's CDS.  CSW shared with MOB that CSW will make a report to [facility] CPS if CDS is positive without an explanation; MOB was understanding and denied CPS hx.   MOB having all essentials needed for infant and feeling prepared to parent.    CSW provided review of Sudden Infant Death Syndrome (SIDS) precautions.    CSW Plan/Description:  No Further Intervention Required/No Barriers to Discharge, Sudden Infant Death Syndrome (SIDS) Education,  Other Information/Referral to [employer], Perinatal Mood and Anxiety Disorder (PMADs) Education, Other Patient/Family Education, [place], CSW Will Continue to Monitor Umbilical Cord Tissue Drug Screen Results and Make Report if Warranted   Laurey Arrow, MSW, LCSW Clinical Social Work [phone]   Dimple Nanas, LCSW Nov 15, 2017, 12:56 PM

## 2018-03-31 NOTE — Progress Notes (Signed)
Patient rating headache 8 out of 10 with sitting and standing and having light sensitivity.  Patient states that she cannot eat because the pain is so bad.  Dr. Mal AmabileBrock with anesthesia was notified and he gave verbal order for fluid bolus of a liter and he will put in other orders.  RN will give and continue to monitor.  Samamtha Tiegs, [place] N

## 2018-03-31 NOTE — Progress Notes (Signed)
Post Partum Day 1 Subjective: no complaints  Objective: Blood pressure (!) 127/58, pulse 92, temperature 97.6 F (36.4 C), temperature source Oral, resp. rate 16, height 5\' 5"  (1.651 m), weight 277 lb 4.8 oz (125.8 kg), SpO2 100 %, unknown if currently breastfeeding.  Physical Exam:  General: alert and cooperative Lochia: appropriate Uterine Fundus: firm Incision: n/a DVT Evaluation: No evidence of DVT seen on physical exam.  Recent Labs    03/30/18 1905 03/31/18 0607  HGB 13.7 11.4*  HCT 40.0 33.9*    Assessment/Plan:  Plan for discharge tomorrow  CHTN - Watch BP closely, no antihypertensives for now   LOS: 2 days   Brandi Casey 03/31/2018, 8:11 AM

## 2018-03-31 NOTE — Consult Note (Addendum)
Called by RN for complaint of headache s/p epidural yesterday. Patient rates headache 8/10 in severity, noted immediately upon sitting or standing. Headache began in early hours this morning. Headache starts in frontal area and quickly spreads to entirety of head and back of neck. Headache 0/10 when lying flat. Photophobia noted. No neck stiffness. Per chart, first epidural was replaced as patient complained of continued pain with contractions. During second epidural placement, a wet tap occurred. Patient has signs and symptoms consistent with postdural puncture headache. Fluid bolus and fioricet ordered. Fioricet not yet given at time of patient interview. 2mg  neostigmine and 1mg  atropine mixed in normal saline given IV by myself, Dr. Mal AmabileBrock, slowly over 4-5 minutes. Patient encouraged to remain lying flat when possible, continue adequate PO fluid intake. RN reminded to provide Fioricet as ordered. Will reassess in 4 hours. Can consider repeating neostigmine and atropine doses. Otherwise, epidural blood patch may be required. Patient understandably hesitant to undergo epidural blood patch given problems with epidural placement yesterday.  Leslye Peerhomas Brock, MD [facility]    Update @ 17:25:  Patient states that headache has improved slightly following above intervention and one dose of fioricet, now 6-7/10 in intensity. Still uninterested in epidural blood patch. Neostigmine 2mg  and Atropine 1mg  in saline redosed at 17:20. Encouraged patient to continue with bedrest when able, continue fluid and caffeine intake, and fioricet. If patient does not improve by morning and has change of heart regarding epidural blood patch, she will have the RN contact the anesthesiologist tomorrow.

## 2018-04-01 MED ORDER — BUTALBITAL-APAP-CAFFEINE 50-325-40 MG PO TABS: 2.0000 | ORAL_TABLET | Freq: Three times a day (TID) | ORAL | 0 refills | Status: AC | PRN

## 2018-04-01 NOTE — Discharge Summary (Signed)
Obstetric Discharge Summary Reason for Admission: induction of labor Prenatal Procedures: none Intrapartum Procedures: spontaneous vaginal delivery Postpartum Procedures: none Complications-Operative and Postpartum: 2nd degree perineal laceration and spinal headache Hemoglobin  Date Value Ref Range Status  03/31/2018 11.4 (L) 12.0 - 15.0 g/dL Final   HCT  Date Value Ref Range Status  03/31/2018 33.9 (L) 36.0 - 46.0 % Final    Physical Exam:  General: alert, cooperative and appears stated age 35Lochia: appropriate Uterine Fundus: firm Incision: healing well, no significant drainage, no dehiscence DVT Evaluation: No evidence of DVT seen on physical exam.  Discharge Diagnoses: Term Pregnancy-delivered  Discharge Information: Date: 04/01/2018 Activity: pelvic rest Diet: routine Medications: fioricet Condition: improved Instructions: refer to practice specific booklet Discharge to: home   Newborn Data: Live born female  Birth Weight: 8 lb 3.5 oz (3728 g) APGAR: 9, 9  Newborn Delivery   Birth date/time:  03/30/2018 17:08:00 Delivery type:  Vaginal, Spontaneous     Home with mother.  Elba Schaber L 04/01/2018, 9:23 AM
